# Patient Record
Sex: Male | Born: 1960 | Race: White | Hispanic: No | Marital: Single
Health system: Southern US, Community
[De-identification: ages and names within clinical notes are randomized; demographics above are authoritative.]

## PROBLEM LIST (undated history)

## (undated) DIAGNOSIS — D472 Monoclonal gammopathy: Secondary | ICD-10-CM

## (undated) HISTORY — DX: Monoclonal gammopathy: D47.2

---

## 2011-12-04 DIAGNOSIS — F339 Major depressive disorder, recurrent, unspecified: Secondary | ICD-10-CM | POA: Diagnosis not present

## 2011-12-04 DIAGNOSIS — F200 Paranoid schizophrenia: Secondary | ICD-10-CM | POA: Diagnosis not present

## 2012-03-23 DIAGNOSIS — F339 Major depressive disorder, recurrent, unspecified: Secondary | ICD-10-CM | POA: Diagnosis not present

## 2012-03-23 DIAGNOSIS — F200 Paranoid schizophrenia: Secondary | ICD-10-CM | POA: Diagnosis not present

## 2012-03-25 DIAGNOSIS — Z79899 Other long term (current) drug therapy: Secondary | ICD-10-CM | POA: Diagnosis not present

## 2012-06-12 DIAGNOSIS — H2510 Age-related nuclear cataract, unspecified eye: Secondary | ICD-10-CM | POA: Diagnosis not present

## 2012-07-23 DIAGNOSIS — F200 Paranoid schizophrenia: Secondary | ICD-10-CM | POA: Diagnosis not present

## 2012-07-23 DIAGNOSIS — F339 Major depressive disorder, recurrent, unspecified: Secondary | ICD-10-CM | POA: Diagnosis not present

## 2012-12-08 DIAGNOSIS — F339 Major depressive disorder, recurrent, unspecified: Secondary | ICD-10-CM | POA: Diagnosis not present

## 2012-12-08 DIAGNOSIS — F200 Paranoid schizophrenia: Secondary | ICD-10-CM | POA: Diagnosis not present

## 2013-04-05 DIAGNOSIS — F209 Schizophrenia, unspecified: Secondary | ICD-10-CM | POA: Diagnosis not present

## 2013-05-10 DIAGNOSIS — F209 Schizophrenia, unspecified: Secondary | ICD-10-CM | POA: Diagnosis not present

## 2013-06-11 DIAGNOSIS — Z79899 Other long term (current) drug therapy: Secondary | ICD-10-CM | POA: Diagnosis not present

## 2013-07-06 DIAGNOSIS — R259 Unspecified abnormal involuntary movements: Secondary | ICD-10-CM | POA: Diagnosis not present

## 2013-07-06 DIAGNOSIS — D649 Anemia, unspecified: Secondary | ICD-10-CM | POA: Diagnosis not present

## 2013-07-06 DIAGNOSIS — I10 Essential (primary) hypertension: Secondary | ICD-10-CM | POA: Diagnosis not present

## 2013-07-06 DIAGNOSIS — J449 Chronic obstructive pulmonary disease, unspecified: Secondary | ICD-10-CM | POA: Diagnosis not present

## 2013-07-06 DIAGNOSIS — R05 Cough: Secondary | ICD-10-CM | POA: Diagnosis not present

## 2013-08-20 DIAGNOSIS — F209 Schizophrenia, unspecified: Secondary | ICD-10-CM | POA: Diagnosis not present

## 2013-09-07 DIAGNOSIS — I10 Essential (primary) hypertension: Secondary | ICD-10-CM | POA: Diagnosis not present

## 2013-09-07 DIAGNOSIS — Z23 Encounter for immunization: Secondary | ICD-10-CM | POA: Diagnosis not present

## 2013-09-07 DIAGNOSIS — J449 Chronic obstructive pulmonary disease, unspecified: Secondary | ICD-10-CM | POA: Diagnosis not present

## 2013-10-29 DIAGNOSIS — F209 Schizophrenia, unspecified: Secondary | ICD-10-CM | POA: Diagnosis not present

## 2013-11-25 DIAGNOSIS — R6889 Other general symptoms and signs: Secondary | ICD-10-CM | POA: Diagnosis not present

## 2013-11-25 DIAGNOSIS — M7989 Other specified soft tissue disorders: Secondary | ICD-10-CM | POA: Diagnosis not present

## 2013-11-25 DIAGNOSIS — S72143A Displaced intertrochanteric fracture of unspecified femur, initial encounter for closed fracture: Secondary | ICD-10-CM | POA: Diagnosis not present

## 2013-11-25 DIAGNOSIS — R9431 Abnormal electrocardiogram [ECG] [EKG]: Secondary | ICD-10-CM | POA: Diagnosis not present

## 2013-11-25 DIAGNOSIS — W19XXXA Unspecified fall, initial encounter: Secondary | ICD-10-CM | POA: Diagnosis not present

## 2013-11-25 DIAGNOSIS — Z043 Encounter for examination and observation following other accident: Secondary | ICD-10-CM | POA: Diagnosis not present

## 2013-11-26 DIAGNOSIS — S7290XD Unspecified fracture of unspecified femur, subsequent encounter for closed fracture with routine healing: Secondary | ICD-10-CM | POA: Diagnosis not present

## 2013-11-26 DIAGNOSIS — S72143A Displaced intertrochanteric fracture of unspecified femur, initial encounter for closed fracture: Secondary | ICD-10-CM | POA: Diagnosis not present

## 2013-11-26 DIAGNOSIS — D62 Acute posthemorrhagic anemia: Secondary | ICD-10-CM | POA: Diagnosis not present

## 2013-11-26 DIAGNOSIS — E871 Hypo-osmolality and hyponatremia: Secondary | ICD-10-CM | POA: Diagnosis not present

## 2013-11-26 DIAGNOSIS — R7989 Other specified abnormal findings of blood chemistry: Secondary | ICD-10-CM | POA: Diagnosis not present

## 2013-11-26 DIAGNOSIS — S72109A Unspecified trochanteric fracture of unspecified femur, initial encounter for closed fracture: Secondary | ICD-10-CM | POA: Diagnosis not present

## 2013-11-26 DIAGNOSIS — I10 Essential (primary) hypertension: Secondary | ICD-10-CM | POA: Diagnosis not present

## 2013-11-27 DIAGNOSIS — K5900 Constipation, unspecified: Secondary | ICD-10-CM | POA: Diagnosis not present

## 2013-11-27 DIAGNOSIS — E871 Hypo-osmolality and hyponatremia: Secondary | ICD-10-CM | POA: Diagnosis not present

## 2013-11-27 DIAGNOSIS — F209 Schizophrenia, unspecified: Secondary | ICD-10-CM | POA: Diagnosis not present

## 2013-11-27 DIAGNOSIS — I10 Essential (primary) hypertension: Secondary | ICD-10-CM | POA: Diagnosis not present

## 2013-12-02 DIAGNOSIS — Z9181 History of falling: Secondary | ICD-10-CM | POA: Diagnosis not present

## 2013-12-02 DIAGNOSIS — R5381 Other malaise: Secondary | ICD-10-CM | POA: Diagnosis not present

## 2013-12-02 DIAGNOSIS — M25559 Pain in unspecified hip: Secondary | ICD-10-CM | POA: Diagnosis not present

## 2013-12-02 DIAGNOSIS — S72009A Fracture of unspecified part of neck of unspecified femur, initial encounter for closed fracture: Secondary | ICD-10-CM | POA: Diagnosis not present

## 2013-12-02 DIAGNOSIS — R05 Cough: Secondary | ICD-10-CM | POA: Diagnosis not present

## 2013-12-02 DIAGNOSIS — Z8611 Personal history of tuberculosis: Secondary | ICD-10-CM | POA: Diagnosis not present

## 2013-12-02 DIAGNOSIS — M6281 Muscle weakness (generalized): Secondary | ICD-10-CM | POA: Diagnosis not present

## 2013-12-02 DIAGNOSIS — R059 Cough, unspecified: Secondary | ICD-10-CM | POA: Diagnosis not present

## 2013-12-02 DIAGNOSIS — F411 Generalized anxiety disorder: Secondary | ICD-10-CM | POA: Diagnosis not present

## 2013-12-02 DIAGNOSIS — F209 Schizophrenia, unspecified: Secondary | ICD-10-CM | POA: Diagnosis not present

## 2013-12-02 DIAGNOSIS — K5900 Constipation, unspecified: Secondary | ICD-10-CM | POA: Diagnosis not present

## 2013-12-02 DIAGNOSIS — S72009D Fracture of unspecified part of neck of unspecified femur, subsequent encounter for closed fracture with routine healing: Secondary | ICD-10-CM | POA: Diagnosis not present

## 2013-12-02 DIAGNOSIS — M949 Disorder of cartilage, unspecified: Secondary | ICD-10-CM | POA: Diagnosis not present

## 2013-12-02 DIAGNOSIS — M818 Other osteoporosis without current pathological fracture: Secondary | ICD-10-CM | POA: Diagnosis not present

## 2013-12-02 DIAGNOSIS — M899 Disorder of bone, unspecified: Secondary | ICD-10-CM | POA: Diagnosis not present

## 2013-12-02 DIAGNOSIS — S72109A Unspecified trochanteric fracture of unspecified femur, initial encounter for closed fracture: Secondary | ICD-10-CM | POA: Diagnosis not present

## 2013-12-02 DIAGNOSIS — I10 Essential (primary) hypertension: Secondary | ICD-10-CM | POA: Diagnosis not present

## 2013-12-02 DIAGNOSIS — E871 Hypo-osmolality and hyponatremia: Secondary | ICD-10-CM | POA: Diagnosis not present

## 2013-12-02 DIAGNOSIS — F2089 Other schizophrenia: Secondary | ICD-10-CM | POA: Diagnosis not present

## 2013-12-03 DIAGNOSIS — F411 Generalized anxiety disorder: Secondary | ICD-10-CM | POA: Diagnosis not present

## 2013-12-03 DIAGNOSIS — I10 Essential (primary) hypertension: Secondary | ICD-10-CM | POA: Diagnosis not present

## 2013-12-03 DIAGNOSIS — S72009A Fracture of unspecified part of neck of unspecified femur, initial encounter for closed fracture: Secondary | ICD-10-CM | POA: Diagnosis not present

## 2013-12-03 DIAGNOSIS — R5381 Other malaise: Secondary | ICD-10-CM | POA: Diagnosis not present

## 2013-12-03 DIAGNOSIS — K5900 Constipation, unspecified: Secondary | ICD-10-CM | POA: Diagnosis not present

## 2013-12-03 DIAGNOSIS — F209 Schizophrenia, unspecified: Secondary | ICD-10-CM | POA: Diagnosis not present

## 2013-12-13 DIAGNOSIS — M899 Disorder of bone, unspecified: Secondary | ICD-10-CM | POA: Diagnosis not present

## 2013-12-13 DIAGNOSIS — M6281 Muscle weakness (generalized): Secondary | ICD-10-CM | POA: Diagnosis not present

## 2013-12-13 DIAGNOSIS — M949 Disorder of cartilage, unspecified: Secondary | ICD-10-CM | POA: Diagnosis not present

## 2013-12-13 DIAGNOSIS — S72109A Unspecified trochanteric fracture of unspecified femur, initial encounter for closed fracture: Secondary | ICD-10-CM | POA: Diagnosis not present

## 2013-12-15 DIAGNOSIS — I10 Essential (primary) hypertension: Secondary | ICD-10-CM | POA: Diagnosis not present

## 2013-12-15 DIAGNOSIS — K5900 Constipation, unspecified: Secondary | ICD-10-CM | POA: Diagnosis not present

## 2013-12-15 DIAGNOSIS — F411 Generalized anxiety disorder: Secondary | ICD-10-CM | POA: Diagnosis not present

## 2013-12-15 DIAGNOSIS — M818 Other osteoporosis without current pathological fracture: Secondary | ICD-10-CM | POA: Diagnosis not present

## 2013-12-15 DIAGNOSIS — R5381 Other malaise: Secondary | ICD-10-CM | POA: Diagnosis not present

## 2013-12-15 DIAGNOSIS — F209 Schizophrenia, unspecified: Secondary | ICD-10-CM | POA: Diagnosis not present

## 2013-12-15 DIAGNOSIS — S72009A Fracture of unspecified part of neck of unspecified femur, initial encounter for closed fracture: Secondary | ICD-10-CM | POA: Diagnosis not present

## 2013-12-22 DIAGNOSIS — IMO0001 Reserved for inherently not codable concepts without codable children: Secondary | ICD-10-CM | POA: Diagnosis not present

## 2013-12-22 DIAGNOSIS — S72009D Fracture of unspecified part of neck of unspecified femur, subsequent encounter for closed fracture with routine healing: Secondary | ICD-10-CM | POA: Diagnosis not present

## 2013-12-24 DIAGNOSIS — IMO0001 Reserved for inherently not codable concepts without codable children: Secondary | ICD-10-CM | POA: Diagnosis not present

## 2013-12-24 DIAGNOSIS — S72009D Fracture of unspecified part of neck of unspecified femur, subsequent encounter for closed fracture with routine healing: Secondary | ICD-10-CM | POA: Diagnosis not present

## 2013-12-27 DIAGNOSIS — S72009D Fracture of unspecified part of neck of unspecified femur, subsequent encounter for closed fracture with routine healing: Secondary | ICD-10-CM | POA: Diagnosis not present

## 2013-12-27 DIAGNOSIS — IMO0001 Reserved for inherently not codable concepts without codable children: Secondary | ICD-10-CM | POA: Diagnosis not present

## 2013-12-30 DIAGNOSIS — S7290XD Unspecified fracture of unspecified femur, subsequent encounter for closed fracture with routine healing: Secondary | ICD-10-CM | POA: Diagnosis not present

## 2013-12-31 DIAGNOSIS — IMO0001 Reserved for inherently not codable concepts without codable children: Secondary | ICD-10-CM | POA: Diagnosis not present

## 2013-12-31 DIAGNOSIS — S72009D Fracture of unspecified part of neck of unspecified femur, subsequent encounter for closed fracture with routine healing: Secondary | ICD-10-CM | POA: Diagnosis not present

## 2014-01-03 DIAGNOSIS — S72009D Fracture of unspecified part of neck of unspecified femur, subsequent encounter for closed fracture with routine healing: Secondary | ICD-10-CM | POA: Diagnosis not present

## 2014-01-03 DIAGNOSIS — IMO0001 Reserved for inherently not codable concepts without codable children: Secondary | ICD-10-CM | POA: Diagnosis not present

## 2014-01-04 DIAGNOSIS — R636 Underweight: Secondary | ICD-10-CM | POA: Diagnosis not present

## 2014-01-04 DIAGNOSIS — I10 Essential (primary) hypertension: Secondary | ICD-10-CM | POA: Diagnosis not present

## 2014-01-05 DIAGNOSIS — IMO0001 Reserved for inherently not codable concepts without codable children: Secondary | ICD-10-CM | POA: Diagnosis not present

## 2014-01-05 DIAGNOSIS — S72009D Fracture of unspecified part of neck of unspecified femur, subsequent encounter for closed fracture with routine healing: Secondary | ICD-10-CM | POA: Diagnosis not present

## 2014-01-06 DIAGNOSIS — R059 Cough, unspecified: Secondary | ICD-10-CM | POA: Diagnosis not present

## 2014-01-06 DIAGNOSIS — Z8781 Personal history of (healed) traumatic fracture: Secondary | ICD-10-CM | POA: Diagnosis not present

## 2014-01-06 DIAGNOSIS — Z87891 Personal history of nicotine dependence: Secondary | ICD-10-CM | POA: Diagnosis not present

## 2014-01-06 DIAGNOSIS — S7290XD Unspecified fracture of unspecified femur, subsequent encounter for closed fracture with routine healing: Secondary | ICD-10-CM | POA: Diagnosis not present

## 2014-01-06 DIAGNOSIS — S72143A Displaced intertrochanteric fracture of unspecified femur, initial encounter for closed fracture: Secondary | ICD-10-CM | POA: Diagnosis not present

## 2014-01-06 DIAGNOSIS — A310 Pulmonary mycobacterial infection: Secondary | ICD-10-CM | POA: Diagnosis not present

## 2014-01-06 DIAGNOSIS — R05 Cough: Secondary | ICD-10-CM | POA: Diagnosis not present

## 2014-01-06 DIAGNOSIS — M899 Disorder of bone, unspecified: Secondary | ICD-10-CM | POA: Diagnosis not present

## 2014-01-06 DIAGNOSIS — M818 Other osteoporosis without current pathological fracture: Secondary | ICD-10-CM | POA: Diagnosis not present

## 2014-01-06 DIAGNOSIS — S7223XA Displaced subtrochanteric fracture of unspecified femur, initial encounter for closed fracture: Secondary | ICD-10-CM | POA: Diagnosis not present

## 2014-01-06 DIAGNOSIS — IMO0002 Reserved for concepts with insufficient information to code with codable children: Secondary | ICD-10-CM | POA: Diagnosis not present

## 2014-01-07 DIAGNOSIS — F209 Schizophrenia, unspecified: Secondary | ICD-10-CM | POA: Diagnosis not present

## 2014-01-27 DIAGNOSIS — J449 Chronic obstructive pulmonary disease, unspecified: Secondary | ICD-10-CM | POA: Diagnosis not present

## 2014-01-27 DIAGNOSIS — R911 Solitary pulmonary nodule: Secondary | ICD-10-CM | POA: Diagnosis not present

## 2014-02-08 DIAGNOSIS — R269 Unspecified abnormalities of gait and mobility: Secondary | ICD-10-CM | POA: Diagnosis not present

## 2014-02-08 DIAGNOSIS — M818 Other osteoporosis without current pathological fracture: Secondary | ICD-10-CM | POA: Diagnosis not present

## 2014-02-08 DIAGNOSIS — E559 Vitamin D deficiency, unspecified: Secondary | ICD-10-CM | POA: Diagnosis not present

## 2014-02-08 DIAGNOSIS — Z9181 History of falling: Secondary | ICD-10-CM | POA: Diagnosis not present

## 2014-03-10 DIAGNOSIS — S72009D Fracture of unspecified part of neck of unspecified femur, subsequent encounter for closed fracture with routine healing: Secondary | ICD-10-CM | POA: Diagnosis not present

## 2014-03-10 DIAGNOSIS — S72143A Displaced intertrochanteric fracture of unspecified femur, initial encounter for closed fracture: Secondary | ICD-10-CM | POA: Diagnosis not present

## 2014-03-10 DIAGNOSIS — M949 Disorder of cartilage, unspecified: Secondary | ICD-10-CM | POA: Diagnosis not present

## 2014-03-10 DIAGNOSIS — M899 Disorder of bone, unspecified: Secondary | ICD-10-CM | POA: Diagnosis not present

## 2014-04-19 DIAGNOSIS — R636 Underweight: Secondary | ICD-10-CM | POA: Diagnosis not present

## 2014-04-19 DIAGNOSIS — J449 Chronic obstructive pulmonary disease, unspecified: Secondary | ICD-10-CM | POA: Diagnosis not present

## 2014-04-19 DIAGNOSIS — M810 Age-related osteoporosis without current pathological fracture: Secondary | ICD-10-CM | POA: Diagnosis not present

## 2014-04-19 DIAGNOSIS — I10 Essential (primary) hypertension: Secondary | ICD-10-CM | POA: Diagnosis not present

## 2014-04-19 DIAGNOSIS — Z23 Encounter for immunization: Secondary | ICD-10-CM | POA: Diagnosis not present

## 2014-04-19 DIAGNOSIS — D649 Anemia, unspecified: Secondary | ICD-10-CM | POA: Diagnosis not present

## 2014-04-19 DIAGNOSIS — R9389 Abnormal findings on diagnostic imaging of other specified body structures: Secondary | ICD-10-CM | POA: Diagnosis not present

## 2014-04-22 ENCOUNTER — Other Ambulatory Visit: Payer: Self-pay | Admitting: Family Medicine

## 2014-04-22 ENCOUNTER — Other Ambulatory Visit (HOSPITAL_BASED_OUTPATIENT_CLINIC_OR_DEPARTMENT_OTHER): Payer: Self-pay | Admitting: Family Medicine

## 2014-04-22 DIAGNOSIS — R9389 Abnormal findings on diagnostic imaging of other specified body structures: Secondary | ICD-10-CM

## 2014-04-23 ENCOUNTER — Ambulatory Visit (HOSPITAL_BASED_OUTPATIENT_CLINIC_OR_DEPARTMENT_OTHER)
Admission: RE | Admit: 2014-04-23 | Discharge: 2014-04-23 | Disposition: A | Discharge status: Home / Self Care | Payer: Medicare Other | Source: Ambulatory Visit | Attending: Family Medicine | Admitting: Family Medicine

## 2014-04-23 DIAGNOSIS — R9389 Abnormal findings on diagnostic imaging of other specified body structures: Secondary | ICD-10-CM | POA: Diagnosis not present

## 2014-04-23 DIAGNOSIS — J449 Chronic obstructive pulmonary disease, unspecified: Secondary | ICD-10-CM | POA: Diagnosis not present

## 2014-04-23 DIAGNOSIS — R0602 Shortness of breath: Secondary | ICD-10-CM | POA: Insufficient documentation

## 2014-04-23 DIAGNOSIS — A150 Tuberculosis of lung: Secondary | ICD-10-CM | POA: Insufficient documentation

## 2014-04-23 MED ORDER — IOHEXOL 350 MG/ML SOLN
100.0000 mL | Freq: Once | INTRAVENOUS | Status: AC | PRN
  Administered 2014-04-23: 100 mL via INTRAVENOUS

## 2014-04-27 ENCOUNTER — Other Ambulatory Visit: Payer: Medicare Other

## 2014-04-27 ENCOUNTER — Other Ambulatory Visit (HOSPITAL_BASED_OUTPATIENT_CLINIC_OR_DEPARTMENT_OTHER): Payer: Medicare Other

## 2014-05-13 DIAGNOSIS — F209 Schizophrenia, unspecified: Secondary | ICD-10-CM | POA: Diagnosis not present

## 2014-05-24 DIAGNOSIS — D649 Anemia, unspecified: Secondary | ICD-10-CM | POA: Diagnosis not present

## 2014-05-24 DIAGNOSIS — Z79899 Other long term (current) drug therapy: Secondary | ICD-10-CM | POA: Diagnosis not present

## 2014-06-03 DIAGNOSIS — D649 Anemia, unspecified: Secondary | ICD-10-CM | POA: Diagnosis not present

## 2014-07-28 ENCOUNTER — Other Ambulatory Visit: Payer: Self-pay | Admitting: Family Medicine

## 2014-07-28 DIAGNOSIS — J984 Other disorders of lung: Secondary | ICD-10-CM

## 2014-07-29 DIAGNOSIS — F209 Schizophrenia, unspecified: Secondary | ICD-10-CM | POA: Diagnosis not present

## 2014-08-19 ENCOUNTER — Ambulatory Visit (INDEPENDENT_AMBULATORY_CARE_PROVIDER_SITE_OTHER): Payer: Medicare Other

## 2014-08-19 ENCOUNTER — Other Ambulatory Visit: Payer: Medicare Other

## 2014-08-19 DIAGNOSIS — J984 Other disorders of lung: Secondary | ICD-10-CM | POA: Diagnosis not present

## 2014-08-19 DIAGNOSIS — J189 Pneumonia, unspecified organism: Secondary | ICD-10-CM | POA: Diagnosis not present

## 2014-08-19 DIAGNOSIS — J479 Bronchiectasis, uncomplicated: Secondary | ICD-10-CM | POA: Diagnosis not present

## 2014-08-23 DIAGNOSIS — K644 Residual hemorrhoidal skin tags: Secondary | ICD-10-CM | POA: Diagnosis not present

## 2014-10-10 DIAGNOSIS — F209 Schizophrenia, unspecified: Secondary | ICD-10-CM | POA: Diagnosis not present

## 2014-11-16 DIAGNOSIS — I10 Essential (primary) hypertension: Secondary | ICD-10-CM | POA: Diagnosis not present

## 2014-11-16 DIAGNOSIS — R251 Tremor, unspecified: Secondary | ICD-10-CM | POA: Diagnosis not present

## 2014-11-16 DIAGNOSIS — R636 Underweight: Secondary | ICD-10-CM | POA: Diagnosis not present

## 2014-11-16 DIAGNOSIS — M810 Age-related osteoporosis without current pathological fracture: Secondary | ICD-10-CM | POA: Diagnosis not present

## 2014-11-16 DIAGNOSIS — R938 Abnormal findings on diagnostic imaging of other specified body structures: Secondary | ICD-10-CM | POA: Diagnosis not present

## 2015-01-26 DIAGNOSIS — F209 Schizophrenia, unspecified: Secondary | ICD-10-CM | POA: Diagnosis not present

## 2015-03-20 DIAGNOSIS — D649 Anemia, unspecified: Secondary | ICD-10-CM | POA: Diagnosis not present

## 2015-03-20 DIAGNOSIS — R636 Underweight: Secondary | ICD-10-CM | POA: Diagnosis not present

## 2015-03-20 DIAGNOSIS — R778 Other specified abnormalities of plasma proteins: Secondary | ICD-10-CM | POA: Diagnosis not present

## 2015-05-16 DIAGNOSIS — F209 Schizophrenia, unspecified: Secondary | ICD-10-CM | POA: Diagnosis not present

## 2015-06-09 DIAGNOSIS — I10 Essential (primary) hypertension: Secondary | ICD-10-CM | POA: Diagnosis not present

## 2015-06-09 DIAGNOSIS — R778 Other specified abnormalities of plasma proteins: Secondary | ICD-10-CM | POA: Diagnosis not present

## 2015-06-09 DIAGNOSIS — D509 Iron deficiency anemia, unspecified: Secondary | ICD-10-CM | POA: Diagnosis not present

## 2015-06-09 DIAGNOSIS — R636 Underweight: Secondary | ICD-10-CM | POA: Diagnosis not present

## 2015-06-09 DIAGNOSIS — Z5181 Encounter for therapeutic drug level monitoring: Secondary | ICD-10-CM | POA: Diagnosis not present

## 2015-06-09 DIAGNOSIS — D649 Anemia, unspecified: Secondary | ICD-10-CM | POA: Diagnosis not present

## 2015-06-15 ENCOUNTER — Telehealth: Payer: Self-pay | Admitting: Hematology & Oncology

## 2015-06-15 NOTE — Telephone Encounter (Signed)
Lt mess regarding new patient appt on 9/28

## 2015-06-16 ENCOUNTER — Telehealth: Payer: Self-pay | Admitting: Hematology & Oncology

## 2015-06-16 NOTE — Telephone Encounter (Signed)
Mailed out new patient letter and calendar

## 2015-07-13 ENCOUNTER — Encounter: Payer: Self-pay | Admitting: Hematology & Oncology

## 2015-07-13 ENCOUNTER — Other Ambulatory Visit: Payer: Medicare Other

## 2015-07-13 ENCOUNTER — Encounter: Payer: Medicare Other | Admitting: Hematology & Oncology

## 2015-07-13 ENCOUNTER — Ambulatory Visit: Payer: Medicare Other

## 2015-07-13 DIAGNOSIS — D472 Monoclonal gammopathy: Secondary | ICD-10-CM | POA: Insufficient documentation

## 2015-07-13 HISTORY — DX: Monoclonal gammopathy: D47.2

## 2015-07-14 NOTE — Progress Notes (Signed)
This encounter was created in error - please disregard.

## 2015-08-15 DIAGNOSIS — F209 Schizophrenia, unspecified: Secondary | ICD-10-CM | POA: Diagnosis not present

## 2015-11-28 DIAGNOSIS — F209 Schizophrenia, unspecified: Secondary | ICD-10-CM | POA: Diagnosis not present

## 2015-12-11 DIAGNOSIS — Z79899 Other long term (current) drug therapy: Secondary | ICD-10-CM | POA: Diagnosis not present

## 2015-12-11 DIAGNOSIS — M810 Age-related osteoporosis without current pathological fracture: Secondary | ICD-10-CM | POA: Diagnosis not present

## 2015-12-11 DIAGNOSIS — Z0000 Encounter for general adult medical examination without abnormal findings: Secondary | ICD-10-CM | POA: Diagnosis not present

## 2015-12-11 DIAGNOSIS — D509 Iron deficiency anemia, unspecified: Secondary | ICD-10-CM | POA: Diagnosis not present

## 2015-12-11 DIAGNOSIS — Z23 Encounter for immunization: Secondary | ICD-10-CM | POA: Diagnosis not present

## 2015-12-11 DIAGNOSIS — I10 Essential (primary) hypertension: Secondary | ICD-10-CM | POA: Diagnosis not present

## 2015-12-11 DIAGNOSIS — R636 Underweight: Secondary | ICD-10-CM | POA: Diagnosis not present

## 2015-12-19 ENCOUNTER — Ambulatory Visit: Payer: Medicare Other | Admitting: Family

## 2015-12-29 IMAGING — CT CT CHEST W/ CM
2 of 3 series · 15 of 36 positions shown, 18 images · IV contrast (APPLIED)
Comparison: 04/19/2014

CLINICAL DATA: TB, cough, shortness of breath

EXAM:
CT CHEST WITH CONTRAST
TECHNIQUE: Multidetector CT imaging of the chest was performed during
intravenous contrast administration.
CONTRAST:  100mL OMNIPAQUE IOHEXOL 350 MG/ML SOLN

[Series 2: chest 5.0 b31f · axial · 0.71mm/px · z∈[-310,-10]mm · 12 of 72 slices shown, 15 images]
[im 6/72  mediastinal]
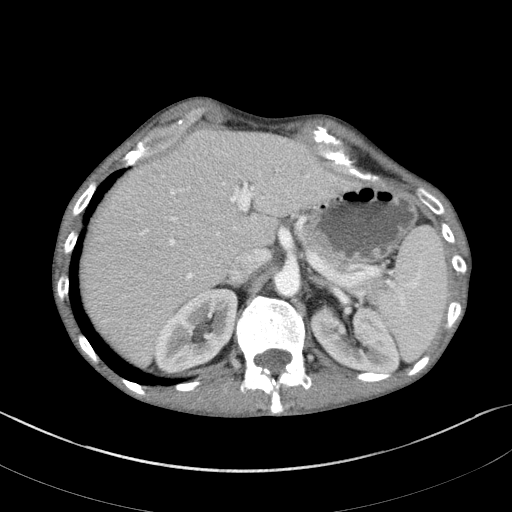
[im 6/72  lung]
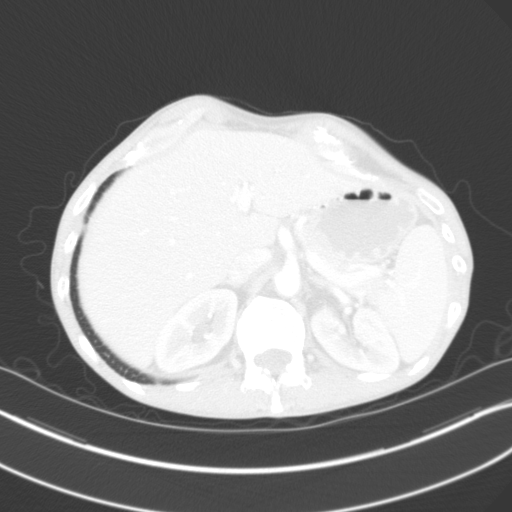
[im 11/72  lung]
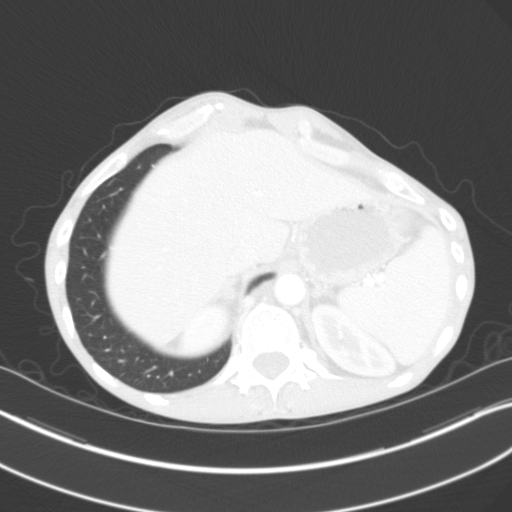
[im 16/72  lung]
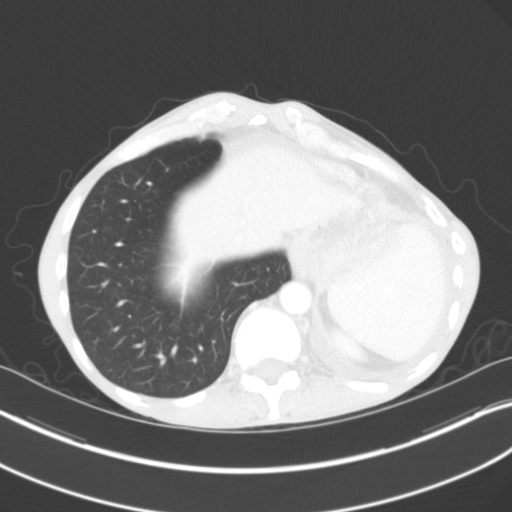
[im 22/72  lung]
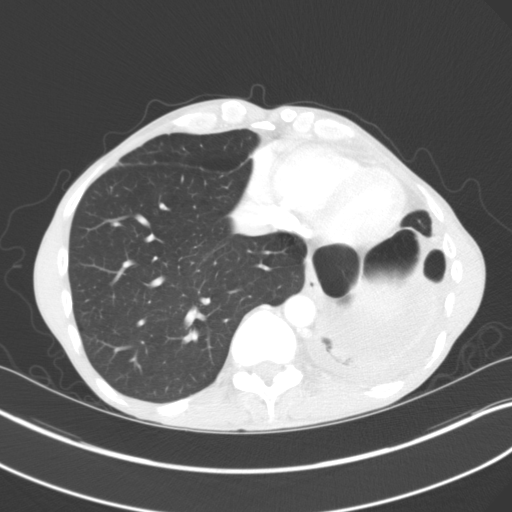
[im 27/72  mediastinal]
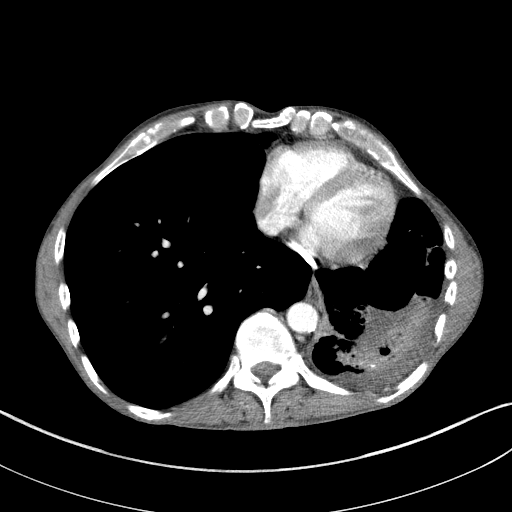
[im 27/72  lung]
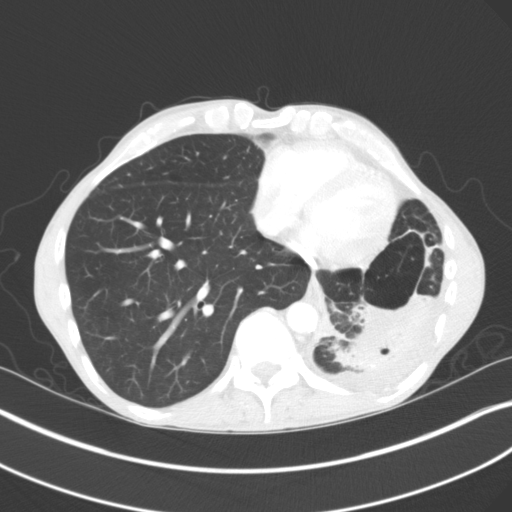
[im 32/72  lung]
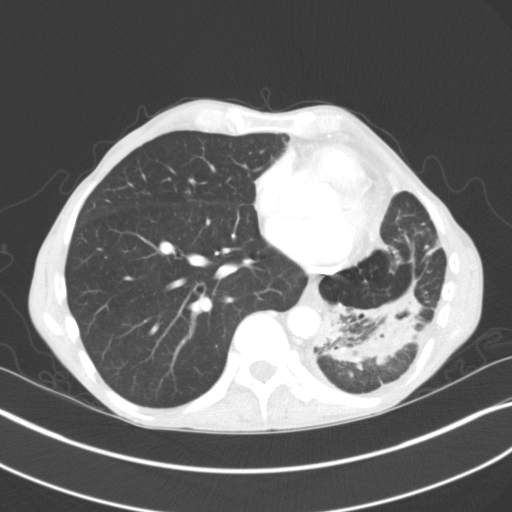
[im 40/72  lung]
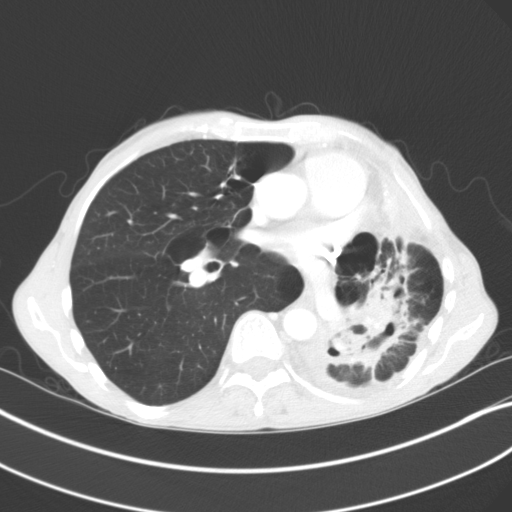
[im 45/72  lung]
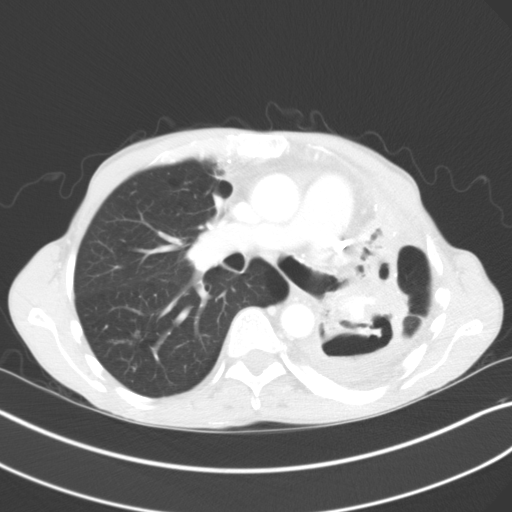
[im 50/72  mediastinal]
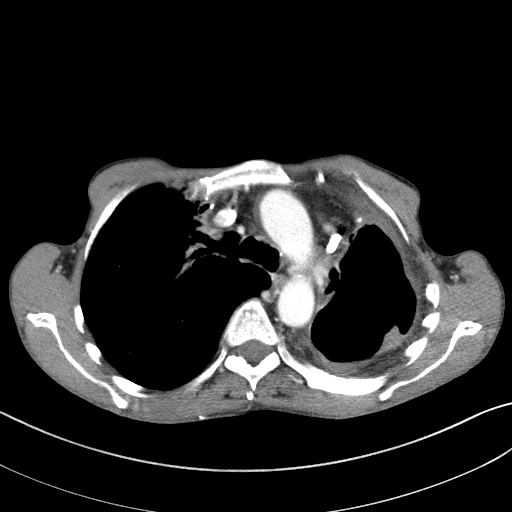
[im 50/72  lung]
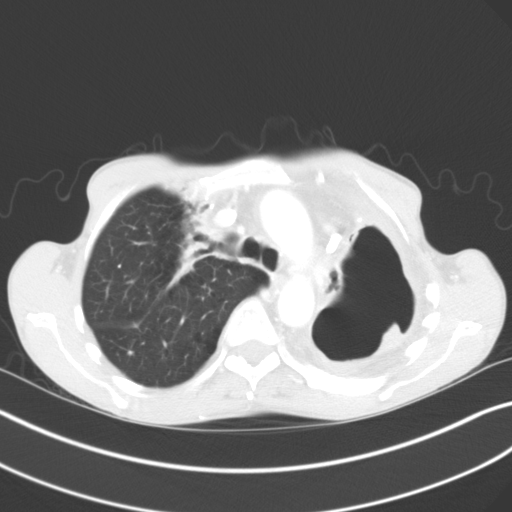
[im 56/72  lung]
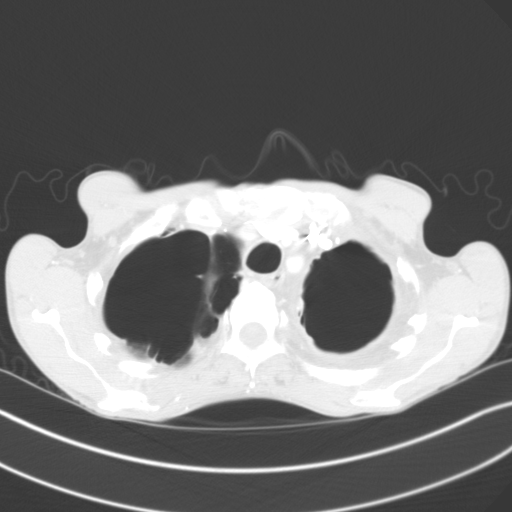
[im 61/72  lung]
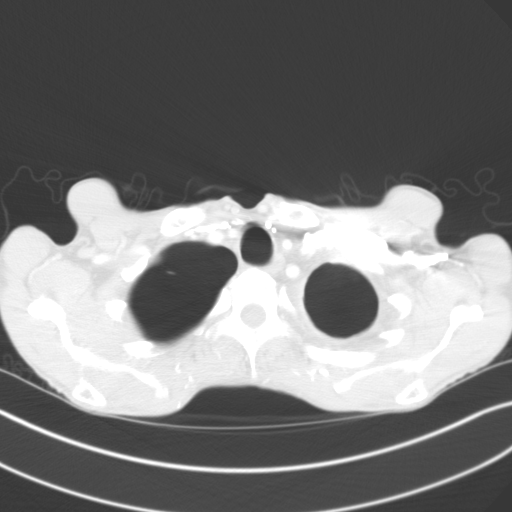
[im 66/72  lung]
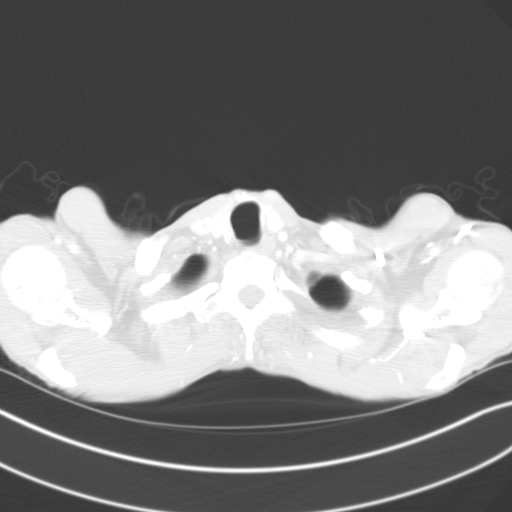

[Series 6: chest 3.0 coronal · coronal · 0.82mm/px · 3 of 77 slices shown]
[im 16/77  lung]
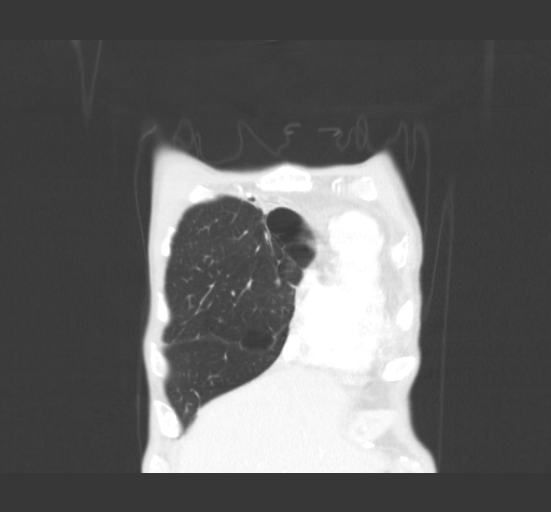
[im 31/77  lung]
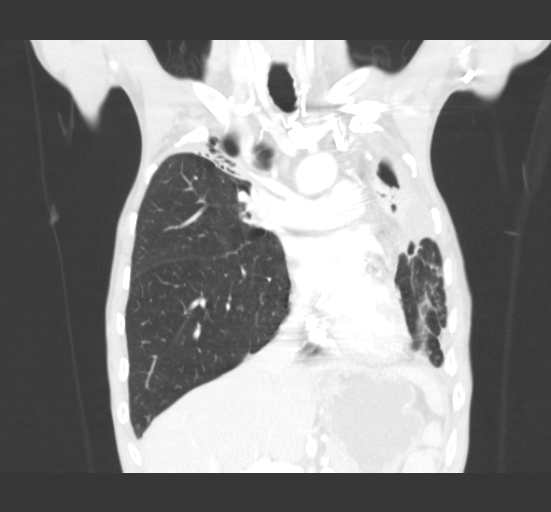
[im 46/77  lung]
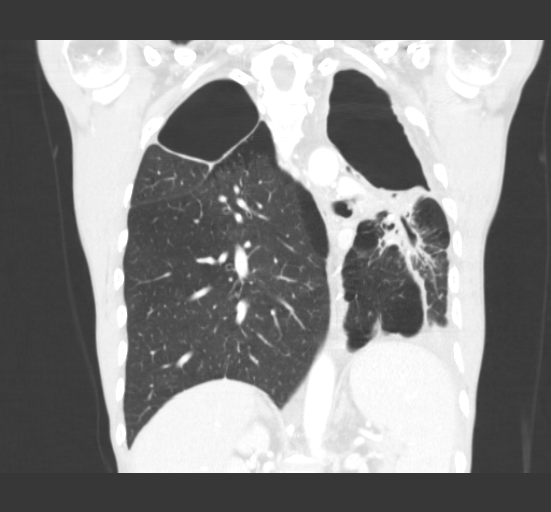

[15 of 36 positions shown; findings below may reference images not displayed]

FINDINGS: Right lung demonstrates significant emphysematous changes with a
large apical bulla. Bronchiectasis of the right upper lobe is noted.
A somewhat spiculated ground-glass area is noted in the right lower
lobe best seen on image number 27 of series 3. This measures 10 mm
in dimension.

The cavitary lesion is again identified in the left apex stable from
the prior plain film examination as well some plain film from 9580.
Considerable scarring is noted in the left lower lobe with some
degree of cavitation. This represents a progression from the prior
exam. The possibility of superimposed infection deserve
consideration. If previous CT is available for comparison that would
be helpful. A small left-sided pleural effusion is noted.
Considerable volume loss is noted on the left

The hilar and mediastinal structures show no significant
lymphadenopathy. There is narrowing of the left innominate vein
centrally. The thoracic aorta and pulmonary artery as visualized are
within normal limits.

Scanning into the upper abdomen reveals no acute abnormality. No
acute bony abnormality is noted.
IMPRESSION: Changes of COPD and changes related to the patient's given clinical
history of prior tuberculosis. It was not be difficult to exclude
some superimposed infection in the inferior aspect of the left lower
lobe.

Area of spiculated ground-glass density in the right lower lobe
measuring 1 cm. Initial follow-up by chest CT without contrast is
recommended in 3 months to confirm persistence. This recommendation
follows the consensus statement: Recommendations for the Management
of Subsolid Pulmonary Nodules Detected at CT: A Statement from the

## 2016-01-16 ENCOUNTER — Ambulatory Visit: Payer: Medicare Other | Admitting: Family

## 2016-01-16 ENCOUNTER — Other Ambulatory Visit: Payer: Medicare Other

## 2016-01-16 ENCOUNTER — Ambulatory Visit: Payer: Medicare Other

## 2016-02-28 DIAGNOSIS — F209 Schizophrenia, unspecified: Secondary | ICD-10-CM | POA: Diagnosis not present

## 2016-05-02 DIAGNOSIS — I10 Essential (primary) hypertension: Secondary | ICD-10-CM | POA: Diagnosis not present

## 2016-05-02 DIAGNOSIS — R636 Underweight: Secondary | ICD-10-CM | POA: Diagnosis not present

## 2016-05-02 DIAGNOSIS — D509 Iron deficiency anemia, unspecified: Secondary | ICD-10-CM | POA: Diagnosis not present

## 2016-06-14 DIAGNOSIS — F209 Schizophrenia, unspecified: Secondary | ICD-10-CM | POA: Diagnosis not present

## 2016-08-28 DIAGNOSIS — F209 Schizophrenia, unspecified: Secondary | ICD-10-CM | POA: Diagnosis not present

## 2016-08-30 DIAGNOSIS — Z13220 Encounter for screening for lipoid disorders: Secondary | ICD-10-CM | POA: Diagnosis not present

## 2016-08-30 DIAGNOSIS — J449 Chronic obstructive pulmonary disease, unspecified: Secondary | ICD-10-CM | POA: Diagnosis not present

## 2016-08-30 DIAGNOSIS — R05 Cough: Secondary | ICD-10-CM | POA: Diagnosis not present

## 2016-08-30 DIAGNOSIS — I10 Essential (primary) hypertension: Secondary | ICD-10-CM | POA: Diagnosis not present

## 2016-08-30 DIAGNOSIS — R636 Underweight: Secondary | ICD-10-CM | POA: Diagnosis not present

## 2016-08-30 DIAGNOSIS — H6122 Impacted cerumen, left ear: Secondary | ICD-10-CM | POA: Diagnosis not present

## 2016-08-30 DIAGNOSIS — R918 Other nonspecific abnormal finding of lung field: Secondary | ICD-10-CM | POA: Diagnosis not present

## 2016-08-30 DIAGNOSIS — Z23 Encounter for immunization: Secondary | ICD-10-CM | POA: Diagnosis not present

## 2016-11-14 DIAGNOSIS — F209 Schizophrenia, unspecified: Secondary | ICD-10-CM | POA: Diagnosis not present

## 2016-12-31 DIAGNOSIS — H16223 Keratoconjunctivitis sicca, not specified as Sjogren's, bilateral: Secondary | ICD-10-CM | POA: Diagnosis not present

## 2016-12-31 DIAGNOSIS — H2512 Age-related nuclear cataract, left eye: Secondary | ICD-10-CM | POA: Diagnosis not present

## 2016-12-31 DIAGNOSIS — H25811 Combined forms of age-related cataract, right eye: Secondary | ICD-10-CM | POA: Diagnosis not present

## 2017-01-30 DIAGNOSIS — H25811 Combined forms of age-related cataract, right eye: Secondary | ICD-10-CM | POA: Diagnosis not present

## 2017-01-30 DIAGNOSIS — Z79899 Other long term (current) drug therapy: Secondary | ICD-10-CM | POA: Diagnosis not present

## 2017-01-30 DIAGNOSIS — I10 Essential (primary) hypertension: Secondary | ICD-10-CM | POA: Diagnosis not present

## 2017-01-30 DIAGNOSIS — H2511 Age-related nuclear cataract, right eye: Secondary | ICD-10-CM | POA: Diagnosis not present

## 2017-01-30 DIAGNOSIS — Z87891 Personal history of nicotine dependence: Secondary | ICD-10-CM | POA: Diagnosis not present

## 2017-02-03 DIAGNOSIS — F209 Schizophrenia, unspecified: Secondary | ICD-10-CM | POA: Diagnosis not present

## 2017-02-17 DIAGNOSIS — Z0000 Encounter for general adult medical examination without abnormal findings: Secondary | ICD-10-CM | POA: Diagnosis not present

## 2017-02-17 DIAGNOSIS — J449 Chronic obstructive pulmonary disease, unspecified: Secondary | ICD-10-CM | POA: Diagnosis not present

## 2017-02-17 DIAGNOSIS — Z79899 Other long term (current) drug therapy: Secondary | ICD-10-CM | POA: Diagnosis not present

## 2017-02-17 DIAGNOSIS — M810 Age-related osteoporosis without current pathological fracture: Secondary | ICD-10-CM | POA: Diagnosis not present

## 2017-02-17 DIAGNOSIS — I10 Essential (primary) hypertension: Secondary | ICD-10-CM | POA: Diagnosis not present

## 2017-02-17 DIAGNOSIS — Z1211 Encounter for screening for malignant neoplasm of colon: Secondary | ICD-10-CM | POA: Diagnosis not present

## 2017-02-17 DIAGNOSIS — K644 Residual hemorrhoidal skin tags: Secondary | ICD-10-CM | POA: Diagnosis not present

## 2017-04-07 DIAGNOSIS — Z1211 Encounter for screening for malignant neoplasm of colon: Secondary | ICD-10-CM | POA: Diagnosis not present

## 2017-05-02 DIAGNOSIS — F209 Schizophrenia, unspecified: Secondary | ICD-10-CM | POA: Diagnosis not present

## 2017-07-21 DIAGNOSIS — F209 Schizophrenia, unspecified: Secondary | ICD-10-CM | POA: Diagnosis not present

## 2017-08-27 DIAGNOSIS — F3189 Other bipolar disorder: Secondary | ICD-10-CM | POA: Diagnosis not present

## 2017-10-17 DIAGNOSIS — F3189 Other bipolar disorder: Secondary | ICD-10-CM | POA: Diagnosis not present

## 2017-12-29 DIAGNOSIS — I10 Essential (primary) hypertension: Secondary | ICD-10-CM | POA: Diagnosis not present

## 2017-12-29 DIAGNOSIS — J449 Chronic obstructive pulmonary disease, unspecified: Secondary | ICD-10-CM | POA: Diagnosis not present

## 2017-12-29 DIAGNOSIS — R636 Underweight: Secondary | ICD-10-CM | POA: Diagnosis not present

## 2017-12-29 DIAGNOSIS — R21 Rash and other nonspecific skin eruption: Secondary | ICD-10-CM | POA: Diagnosis not present

## 2017-12-29 DIAGNOSIS — Z23 Encounter for immunization: Secondary | ICD-10-CM | POA: Diagnosis not present

## 2018-01-16 DIAGNOSIS — F3189 Other bipolar disorder: Secondary | ICD-10-CM | POA: Diagnosis not present

## 2018-04-20 DIAGNOSIS — F3189 Other bipolar disorder: Secondary | ICD-10-CM | POA: Diagnosis not present

## 2018-07-09 DIAGNOSIS — D509 Iron deficiency anemia, unspecified: Secondary | ICD-10-CM | POA: Diagnosis not present

## 2018-07-09 DIAGNOSIS — J449 Chronic obstructive pulmonary disease, unspecified: Secondary | ICD-10-CM | POA: Diagnosis not present

## 2018-07-09 DIAGNOSIS — Z0000 Encounter for general adult medical examination without abnormal findings: Secondary | ICD-10-CM | POA: Diagnosis not present

## 2018-07-09 DIAGNOSIS — R636 Underweight: Secondary | ICD-10-CM | POA: Diagnosis not present

## 2018-07-09 DIAGNOSIS — M810 Age-related osteoporosis without current pathological fracture: Secondary | ICD-10-CM | POA: Diagnosis not present

## 2018-07-09 DIAGNOSIS — Z125 Encounter for screening for malignant neoplasm of prostate: Secondary | ICD-10-CM | POA: Diagnosis not present

## 2018-07-09 DIAGNOSIS — I10 Essential (primary) hypertension: Secondary | ICD-10-CM | POA: Diagnosis not present

## 2018-07-24 DIAGNOSIS — F3189 Other bipolar disorder: Secondary | ICD-10-CM | POA: Diagnosis not present

## 2018-10-19 DIAGNOSIS — F3189 Other bipolar disorder: Secondary | ICD-10-CM | POA: Diagnosis not present

## 2019-02-12 DEATH — deceased
